# Patient Record
Sex: Male | Born: 1985 | Race: White | Hispanic: No | Marital: Single | Smoking: Current every day smoker
Health system: Southern US, Community
[De-identification: ages and names within clinical notes are randomized; demographics above are authoritative.]

---
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
## 2006-01-23 ENCOUNTER — Emergency Department: Payer: Self-pay | Admitting: Internal Medicine

## 2006-11-10 ENCOUNTER — Emergency Department: Payer: Self-pay | Admitting: Emergency Medicine

## 2007-04-28 ENCOUNTER — Emergency Department: Payer: Self-pay | Admitting: Emergency Medicine

## 2007-05-04 ENCOUNTER — Emergency Department: Payer: Self-pay | Admitting: Emergency Medicine

## 2007-10-09 ENCOUNTER — Emergency Department: Payer: Self-pay | Admitting: Emergency Medicine

## 2007-10-11 ENCOUNTER — Emergency Department: Payer: Self-pay | Admitting: Unknown Physician Specialty

## 2007-11-11 IMAGING — CR CERVICAL SPINE - 2-3 VIEW
1 series · 3 of 3 positions shown · non-contrast
Comparison: none

REASON FOR EXAM: NECK PAIN
COMMENTS:

[Series 1: view not recorded · 0.17mm/px · 3 of 3 slices shown]
[im 1/3]
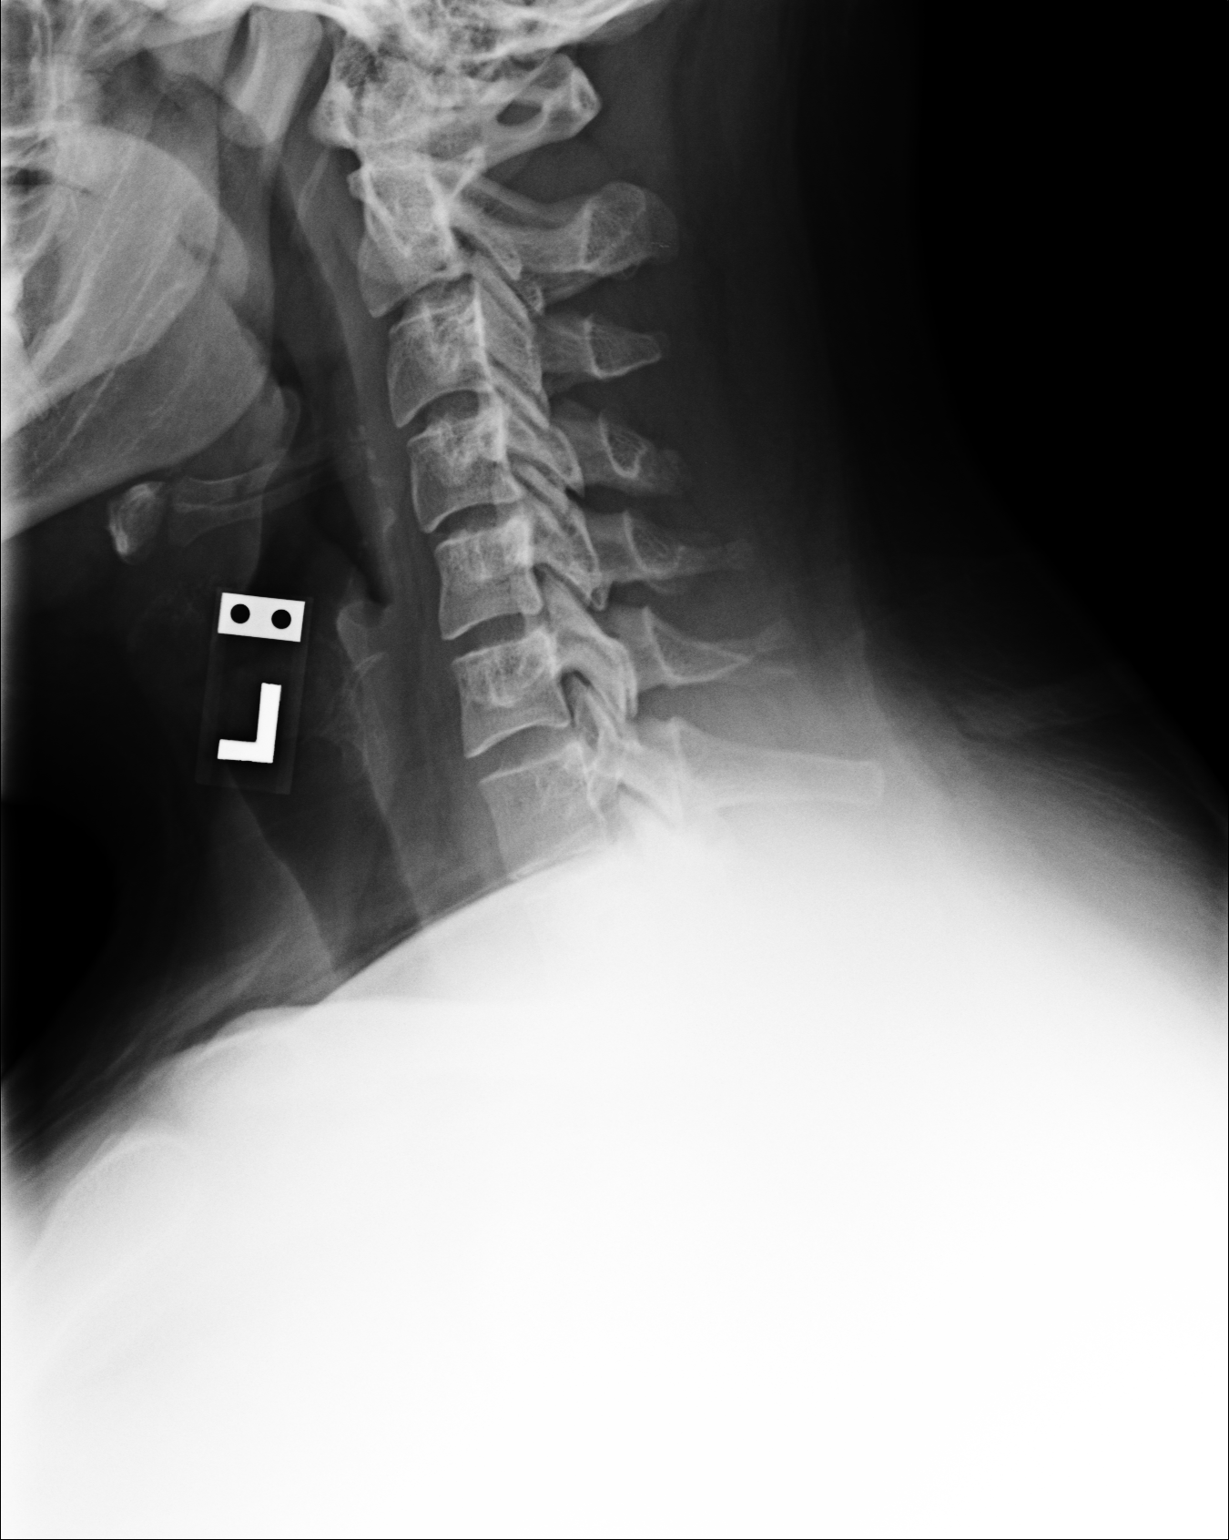
[im 2/3]
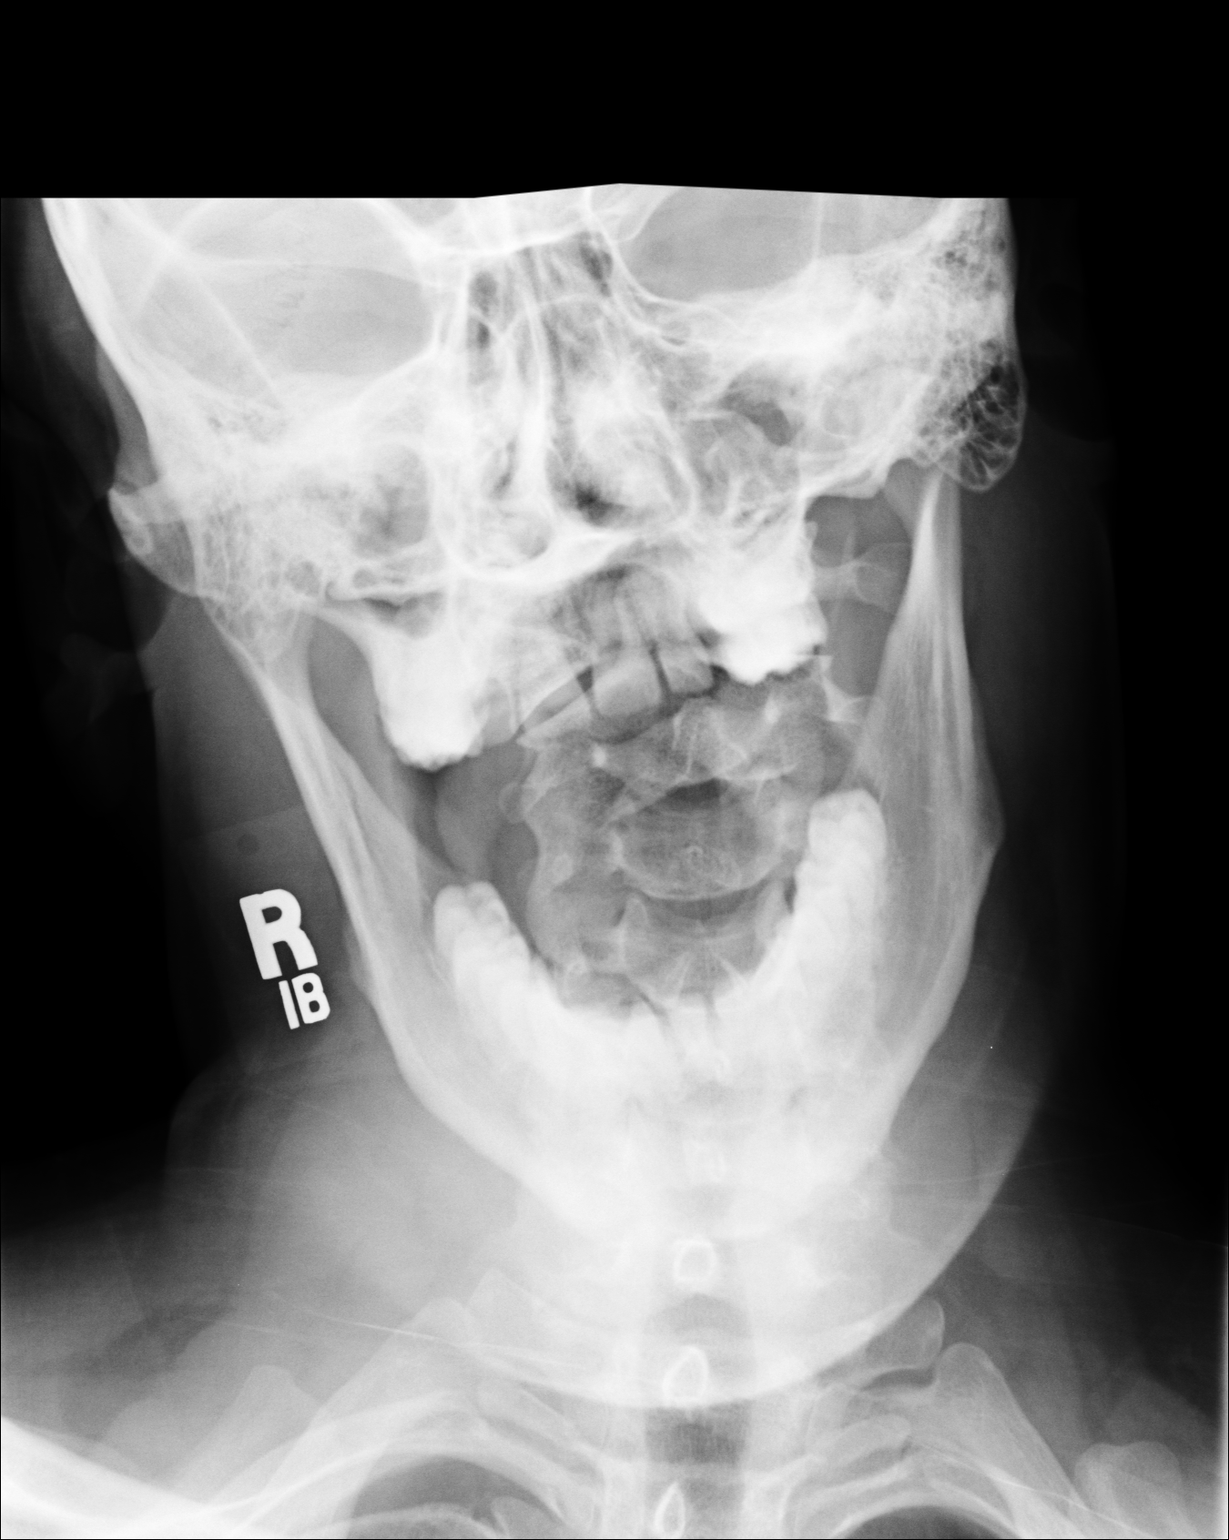
[im 3/3]
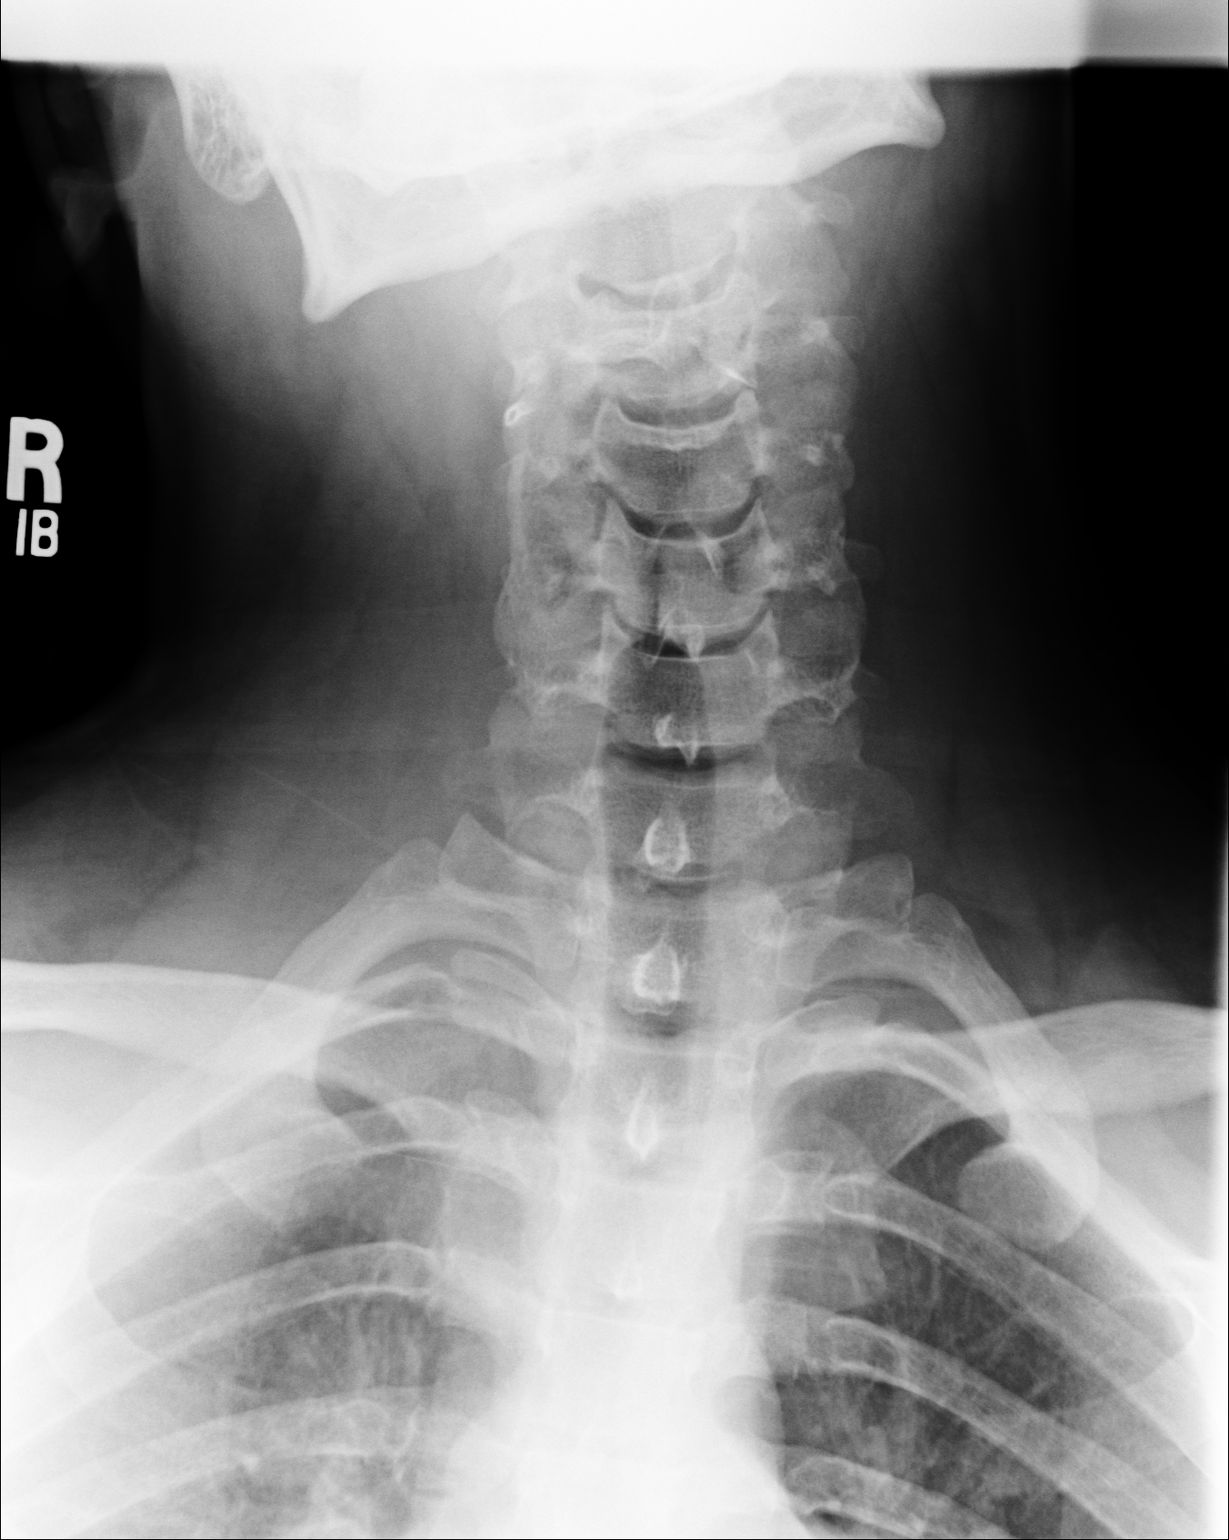

[3 of 3 positions shown; findings below may reference images not displayed]

PROCEDURE:     DXR - DXR C- SPINE AP AND LATERAL  - December 31, 2005  [DATE]

RESULT:          No acute soft tissue or bony abnormalities are identified.
Torticollis of the cervical spine is noted with angulation of the head to
the RIGHT.  The degree of torticollis may be severe, as the head is severely
angulated.  Again there is no evidence of fracture or dislocation.
IMPRESSION: 1.     Severe angulation of the head to the RIGHT on AP view.  This suggests
severe torticollis/spasm.
2.     No acute bony or joint abnormality is identified.

## 2007-11-20 ENCOUNTER — Emergency Department: Payer: Self-pay | Admitting: Emergency Medicine

## 2007-11-24 ENCOUNTER — Emergency Department: Payer: Self-pay | Admitting: Emergency Medicine

## 2009-07-05 ENCOUNTER — Emergency Department: Payer: Self-pay | Admitting: Internal Medicine

## 2009-09-20 ENCOUNTER — Emergency Department: Payer: Self-pay | Admitting: Emergency Medicine

## 2009-12-24 ENCOUNTER — Emergency Department (HOSPITAL_COMMUNITY): Admission: EM | Admit: 2009-12-24 | Discharge: 2009-12-24 | Payer: Self-pay | Admitting: Emergency Medicine

## 2010-01-18 ENCOUNTER — Emergency Department: Payer: Self-pay | Admitting: Emergency Medicine

## 2010-01-19 ENCOUNTER — Emergency Department (HOSPITAL_COMMUNITY): Admission: EM | Admit: 2010-01-19 | Discharge: 2010-01-19 | Payer: Self-pay | Admitting: Emergency Medicine
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
## 2012-09-18 ENCOUNTER — Emergency Department: Payer: Self-pay | Admitting: Emergency Medicine

## 2013-01-02 ENCOUNTER — Emergency Department: Payer: Self-pay | Admitting: Unknown Physician Specialty

## 2013-04-28 ENCOUNTER — Emergency Department: Payer: Self-pay | Admitting: Emergency Medicine

## 2016-03-18 ENCOUNTER — Emergency Department
Admission: EM | Admit: 2016-03-18 | Discharge: 2016-03-18 | Disposition: A | Discharge status: Home / Self Care | Payer: Self-pay | Attending: Emergency Medicine | Admitting: Emergency Medicine

## 2016-03-18 ENCOUNTER — Encounter: Payer: Self-pay | Admitting: Emergency Medicine

## 2016-03-18 DIAGNOSIS — M546 Pain in thoracic spine: Secondary | ICD-10-CM

## 2016-03-18 DIAGNOSIS — X500XXA Overexertion from strenuous movement or load, initial encounter: Secondary | ICD-10-CM | POA: Insufficient documentation

## 2016-03-18 DIAGNOSIS — Y9269 Other specified industrial and construction area as the place of occurrence of the external cause: Secondary | ICD-10-CM | POA: Insufficient documentation

## 2016-03-18 DIAGNOSIS — S238XXA Sprain of other specified parts of thorax, initial encounter: Secondary | ICD-10-CM | POA: Insufficient documentation

## 2016-03-18 DIAGNOSIS — Y9389 Activity, other specified: Secondary | ICD-10-CM | POA: Insufficient documentation

## 2016-03-18 DIAGNOSIS — S239XXA Sprain of unspecified parts of thorax, initial encounter: Secondary | ICD-10-CM

## 2016-03-18 DIAGNOSIS — M545 Pain in thoracic spine: Secondary | ICD-10-CM

## 2016-03-18 DIAGNOSIS — Y990 Civilian activity done for income or pay: Secondary | ICD-10-CM | POA: Insufficient documentation

## 2016-03-18 DIAGNOSIS — F17210 Nicotine dependence, cigarettes, uncomplicated: Secondary | ICD-10-CM | POA: Insufficient documentation

## 2016-03-18 MED ORDER — CYCLOBENZAPRINE HCL 5 MG PO TABS: 5.0000 mg | ORAL_TABLET | Freq: Three times a day (TID) | ORAL | 0 refills | Status: AC | PRN

## 2016-03-18 MED ORDER — CYCLOBENZAPRINE HCL 10 MG PO TABS
10.0000 mg | ORAL_TABLET | Freq: Once | ORAL | Status: AC
  Administered 2016-03-18: 10 mg via ORAL
  Filled 2016-03-18: qty 1

## 2016-03-18 MED ORDER — PREDNISONE 10 MG PO TABS: 10.0000 mg | ORAL_TABLET | Freq: Two times a day (BID) | ORAL | 0 refills | Status: AC

## 2016-03-18 NOTE — Discharge Instructions (Signed)
Take the prescription meds as directed. Apply ice to any sore muscles. Monitor body mechanics and lift using legs, keeping items close to the body. Follow-up with one of the local community clinics as needed.

## 2016-03-18 NOTE — ED Notes (Signed)
This RN spoke with patient regarding filing worker's comp. Pt states that he did not want to file as worker's comp today, that he would take bill to his company after.

## 2016-03-18 NOTE — ED Triage Notes (Signed)
Pt states was at work and picked up a door and felt a pop and sharp pain in his lower back.  Pt states by the end of his shift pain had radiated up to both shoulders. Pt ambulatory from the lobby. Pt states he does not want to file as workers comp at this time.

## 2016-03-23 NOTE — ED Provider Notes (Signed)
[facility] Emergency Department Provider Note ____________________________________________  Time seen: 1246  I have reviewed the triage vital signs and the nursing notes.  HISTORY  Chief Complaint  Back Pain and Shoulder Pain  HPI Christian FickCharles Hochstatter Jr. is a 30 y.o. 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 Since that time he's had increased low back pain with referral of the discomfort up the back to both sides of his shoulder blades. He denies any lower extremities referral, bladder or bowel incontinence, or current distal paresthesias. He presents now noting pain at a 7/10 at rest and up to a 10/10 with movement.   History reviewed. No pertinent past medical history.  There are no active problems to display for this patient.  History reviewed. No pertinent surgical history.  Prior to Admission medications   Medication Sig Start Date End Date Taking? Authorizing Provider  cyclobenzaprine (FLEXERIL) 5 MG tablet Take 1 tablet (5 mg total) by mouth 3 (three) times daily as needed for muscle spasms. 03/18/16   Lance Huaracha V Bacon Kleigh Hoelzer, PA-C  predniSONE (DELTASONE) 10 MG tablet Take 1 tablet (10 mg total) by mouth 2 (two) times daily with a meal. 03/18/16   Sawyer Mentzer V Bacon Luwanna Brossman, PA-C   Allergies Asa [aspirin]; Mushroom extract complex; and Phenergan [promethazine hcl]  No family history on file.  Social History Social History  Substance Use Topics  . Smoking status: Current Every Day Smoker    Packs/day: 1.00    Types: Cigarettes  . Smokeless tobacco: Never Used  . Alcohol use No    Review of Systems  Constitutional: Negative for fever. Eyes:  Negative for visual changes. ENT: Negative for sore throat. Cardiovascular: Negative for chest pain. Respiratory: Negative for shortness of breath. Gastrointestinal: Negative for abdominal pain, vomiting and diarrhea. Genitourinary: Negative for dysuria. Musculoskeletal: Negative for back pain. Skin: Negative for rash. Neurological: Negative for headaches, focal weakness or numbness. ____________________________________________  PHYSICAL EXAM:  VITAL SIGNS: ED Triage Vitals  Enc Vitals Group     BP 03/18/16 1200 (!) 148/94     Pulse Rate 03/18/16 1200 84     Resp 03/18/16 1200 16     Temp 03/18/16 1200 98.8 F (37.1 C)     Temp Source 03/18/16 1200 Oral     SpO2 03/18/16 1200 96 %     Weight 03/18/16 1211 240 lb (108.9 kg)     Height 03/18/16 1211 6\' 1"  (1.854 m)     Head Circumference --      Peak Flow --      Pain Score 03/18/16 1212 10     Pain Loc --      Pain Edu? --      Excl. in GC? --     Constitutional: Alert and oriented. Well appearing and in no distress. She was found to be resting comfortably in the bed, using his cell phone as I enter the room. Head: Normocephalic and atraumatic. Cardiovascular: Normal rate, regular rhythm.  Respiratory: Normal respiratory effort. No wheezes/rales/rhonchi. Gastrointestinal: Soft and nontender. No distention. Musculoskeletal: Normal spinal alignment without midline tenderness, spasm, deformity, step-off. Patient with minimal tenderness to palpation over the  upper trapezius muscles bilaterally. He is able to transition from supine to sit without assistance. Noted have a negative seated straight leg raise. Nontender with normal range of motion in all extremities.  Neurologic:  Cranial nerves II through XII grossly intact. Normal LE/UE DTRs bilaterally.. Normal speech and language. No gross focal neurologic deficits are appreciated. Skin:  Skin is warm, dry and intact. No rash  noted. ____________________________________________  PROCEDURES Flexeril 5 mg PO ____________________________________________  INITIAL IMPRESSION / ASSESSMENT AND PLAN / ED COURSE  Patient with what appears to be an acute thoracolumbar muscle strain without indication of neuromuscular deficit. No indication at this time for radiologic imaging. Patient will be discharged with a prescription for Flexeril and prednisone doses directed. Work note is provided for today as requested. He will follow with his primary care provider or return to the ED for acutely worsening symptoms as discussed.  Clinical Course   ____________________________________________  FINAL CLINICAL IMPRESSION(S) / ED DIAGNOSES  Final diagnoses:  Thoracic back sprain, initial encounter  Thoracolumbar back pain      Christian Hoard, PA-C 03/23/16 2346    Christian Natal, MD 04/07/16 0008

## 2022-06-16 ENCOUNTER — Other Ambulatory Visit: Payer: Self-pay

## 2022-06-16 ENCOUNTER — Encounter (HOSPITAL_COMMUNITY): Payer: Self-pay

## 2022-06-16 ENCOUNTER — Emergency Department (HOSPITAL_COMMUNITY): Payer: 59

## 2022-06-16 ENCOUNTER — Emergency Department (HOSPITAL_COMMUNITY)
Admission: EM | Admit: 2022-06-16 | Discharge: 2022-06-16 | Disposition: A | Discharge status: Home / Self Care | Payer: 59 | Attending: Emergency Medicine | Admitting: Emergency Medicine

## 2022-06-16 DIAGNOSIS — F17210 Nicotine dependence, cigarettes, uncomplicated: Secondary | ICD-10-CM | POA: Diagnosis not present

## 2022-06-16 DIAGNOSIS — W3400XA Accidental discharge from unspecified firearms or gun, initial encounter: Secondary | ICD-10-CM | POA: Insufficient documentation

## 2022-06-16 DIAGNOSIS — S61439A Puncture wound without foreign body of unspecified hand, initial encounter: Secondary | ICD-10-CM

## 2022-06-16 DIAGNOSIS — S61402A Unspecified open wound of left hand, initial encounter: Secondary | ICD-10-CM | POA: Diagnosis not present

## 2022-06-16 DIAGNOSIS — Y9389 Activity, other specified: Secondary | ICD-10-CM | POA: Insufficient documentation

## 2022-06-16 DIAGNOSIS — S61432A Puncture wound without foreign body of left hand, initial encounter: Secondary | ICD-10-CM | POA: Diagnosis not present

## 2022-06-16 MED ORDER — CEPHALEXIN 500 MG PO CAPS: 500.0000 mg | ORAL_CAPSULE | Freq: Four times a day (QID) | ORAL | 0 refills | Status: DC

## 2022-06-16 MED ORDER — CEPHALEXIN 500 MG PO CAPS: 500.0000 mg | ORAL_CAPSULE | Freq: Four times a day (QID) | ORAL | 0 refills | Status: AC

## 2022-06-16 MED ORDER — OXYCODONE-ACETAMINOPHEN 5-325 MG PO TABS: 1.0000 | ORAL_TABLET | Freq: Four times a day (QID) | ORAL | 0 refills | Status: AC | PRN

## 2022-06-16 MED ORDER — MORPHINE SULFATE (PF) 4 MG/ML IV SOLN
4.0000 mg | Freq: Once | INTRAVENOUS | Status: AC
  Administered 2022-06-16: 4 mg via INTRAVENOUS
  Filled 2022-06-16: qty 1

## 2022-06-16 MED ORDER — TETANUS-DIPHTH-ACELL PERTUSSIS 5-2.5-18.5 LF-MCG/0.5 IM SUSY
0.5000 mL | PREFILLED_SYRINGE | Freq: Once | INTRAMUSCULAR | Status: DC
  Filled 2022-06-16: qty 0.5

## 2022-06-16 MED ORDER — SODIUM CHLORIDE 0.9 % IV BOLUS
1000.0000 mL | Freq: Once | INTRAVENOUS | Status: AC
  Administered 2022-06-16: 1000 mL via INTRAVENOUS

## 2022-06-16 MED ORDER — ONDANSETRON HCL 4 MG/2ML IJ SOLN: 4.0000 mg | Freq: Once | INTRAMUSCULAR | Status: DC

## 2022-06-16 MED ORDER — CEFAZOLIN SODIUM-DEXTROSE 1-4 GM/50ML-% IV SOLN
1.0000 g | Freq: Once | INTRAVENOUS | Status: AC
  Administered 2022-06-16: 1 g via INTRAVENOUS
  Filled 2022-06-16: qty 50
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
## 2022-06-16 NOTE — Discharge Instructions (Addendum)
You are prescribed antibiotics on today's visit, please take 1 tablet 4 times a day for the next 14 days.  You will need to follow-up with your hand specialist Dr. Kerry Fort next week.  You may alternate tylenol or iburpofen for pain control.    Dial soap soaks  Supplies needed: -Liquid Dial Antibacterial soap -clean basin large enough to submerge wound (to be rinsed and dried between uses) -clean dry towel -Over-the-counter triple antibiotic ointment (neosporin or similar) -Over-the-counter Petroleum impregnated dressing (Xeroform or similar) -Sterile gauze -comfortable tape or self adhesive wrap   Perform 2-3 times per day: Soak wound in soapy water for 10-15 minutes in clean basin Gently pat dry Cover wound generously with bacitracin/neosporin or plain Vaseline (petroleum gel) Place petroleum impregnated dressing over wound (Xeroform or similar) Cover with fresh sterile gauze and loose wrap   Ramon Dredge, MD [facility] [phone]

## 2022-06-25 DIAGNOSIS — S61432D Puncture wound without foreign body of left hand, subsequent encounter: Secondary | ICD-10-CM | POA: Diagnosis not present

## 2022-07-05 DIAGNOSIS — S61432D Puncture wound without foreign body of left hand, subsequent encounter: Secondary | ICD-10-CM | POA: Diagnosis not present

## 2022-11-23 DIAGNOSIS — Z23 Encounter for immunization: Secondary | ICD-10-CM | POA: Diagnosis not present

## 2022-11-23 DIAGNOSIS — Z051 Observation and evaluation of newborn for suspected infectious condition ruled out: Secondary | ICD-10-CM | POA: Diagnosis not present

## 2022-11-24 DIAGNOSIS — Z051 Observation and evaluation of newborn for suspected infectious condition ruled out: Secondary | ICD-10-CM | POA: Diagnosis not present

## 2022-11-25 DIAGNOSIS — Z051 Observation and evaluation of newborn for suspected infectious condition ruled out: Secondary | ICD-10-CM | POA: Diagnosis not present

## 2022-11-26 DIAGNOSIS — Z051 Observation and evaluation of newborn for suspected infectious condition ruled out: Secondary | ICD-10-CM | POA: Diagnosis not present

## 2022-11-27 DIAGNOSIS — Z051 Observation and evaluation of newborn for suspected infectious condition ruled out: Secondary | ICD-10-CM | POA: Diagnosis not present

## 2022-11-28 DIAGNOSIS — Z051 Observation and evaluation of newborn for suspected infectious condition ruled out: Secondary | ICD-10-CM | POA: Diagnosis not present

## 2022-11-30 DIAGNOSIS — Z00110 Health examination for newborn under 8 days old: Secondary | ICD-10-CM | POA: Diagnosis not present

## 2022-11-30 DIAGNOSIS — Z1330 Encounter for screening examination for mental health and behavioral disorders, unspecified: Secondary | ICD-10-CM | POA: Diagnosis not present

## 2022-11-30 DIAGNOSIS — Z713 Dietary counseling and surveillance: Secondary | ICD-10-CM | POA: Diagnosis not present

## 2022-12-03 DIAGNOSIS — L22 Diaper dermatitis: Secondary | ICD-10-CM | POA: Diagnosis not present

## 2022-12-08 DIAGNOSIS — B372 Candidiasis of skin and nail: Secondary | ICD-10-CM | POA: Diagnosis not present

## 2023-05-18 DIAGNOSIS — R059 Cough, unspecified: Secondary | ICD-10-CM | POA: Diagnosis not present

## 2023-05-18 DIAGNOSIS — H66002 Acute suppurative otitis media without spontaneous rupture of ear drum, left ear: Secondary | ICD-10-CM | POA: Diagnosis not present

## 2023-05-18 DIAGNOSIS — J209 Acute bronchitis, unspecified: Secondary | ICD-10-CM | POA: Diagnosis not present
